# Patient Record
Sex: Male | Born: 1945 | Race: Black or African American | Hispanic: No | Marital: Married
Health system: Southern US, Community
[De-identification: ages and names within clinical notes are randomized; demographics above are authoritative.]

---

## 2006-07-31 ENCOUNTER — Emergency Department: Payer: Self-pay | Admitting: Emergency Medicine

## 2007-06-08 ENCOUNTER — Ambulatory Visit: Payer: Self-pay | Admitting: Gastroenterology

## 2008-11-12 IMAGING — CR DG HUMERUS 2V *R*
1 series · 3 of 3 positions shown · non-contrast
Comparison: none

REASON FOR EXAM: pain, rm 2
COMMENTS:

PROCEDURE:     DXR - DXR HUMERUS RIGHT  - July 31, 2006  [DATE]
RESULT:     No fracture, dislocation or other acute bony abnormality is
identified.

[Series 1: view not recorded · 0.17mm/px · 3 of 3 slices shown]
[im 1/3]
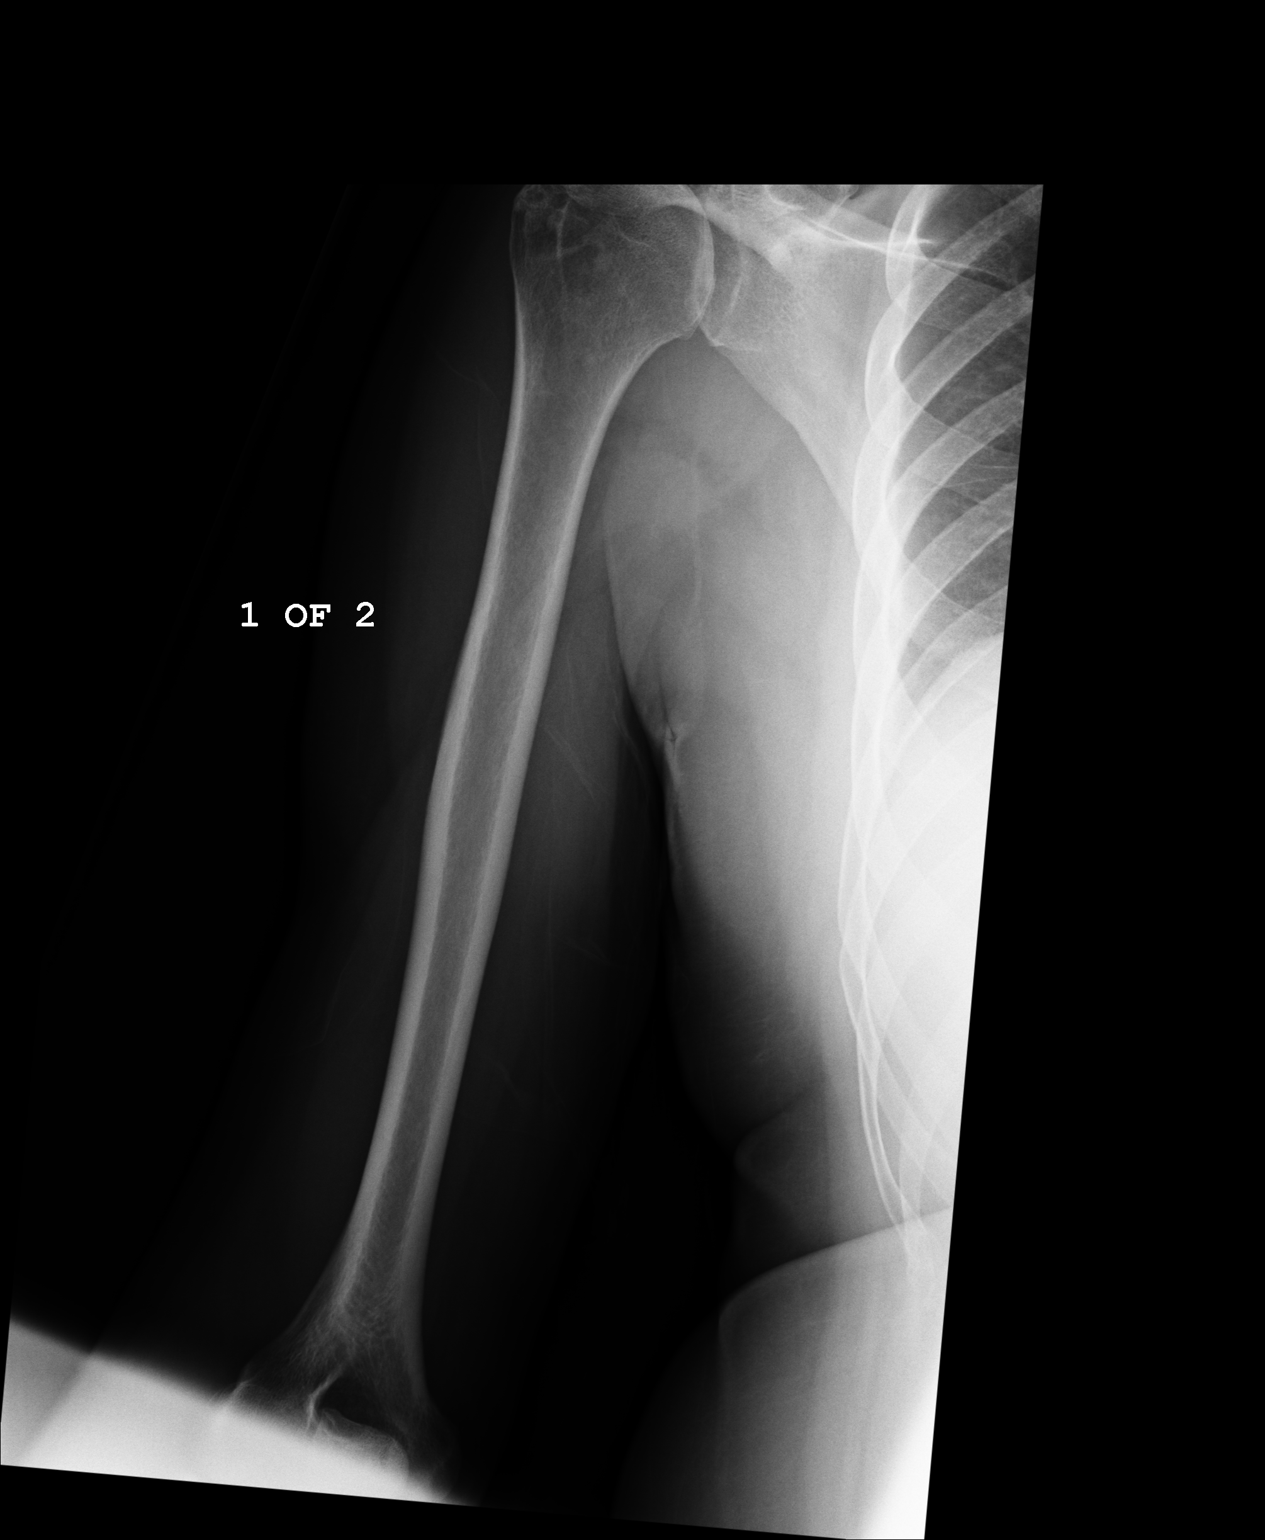
[im 2/3]
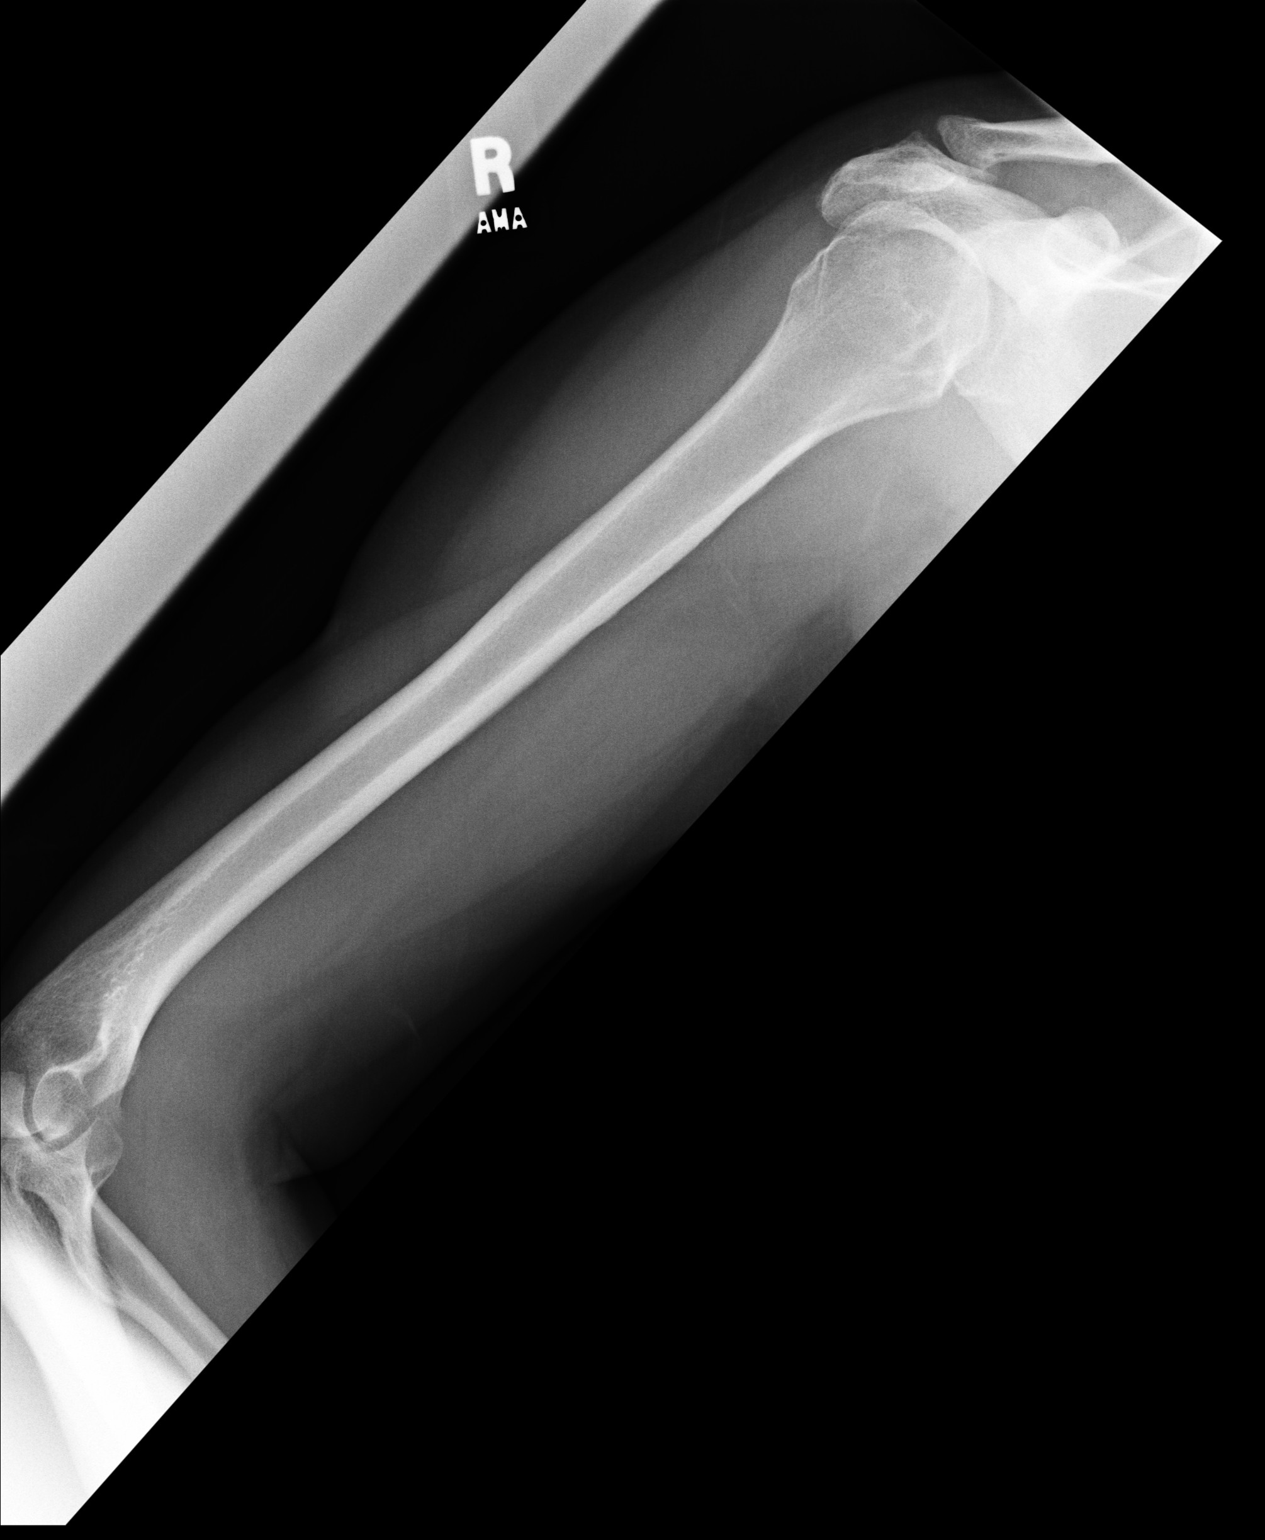
[im 3/3]
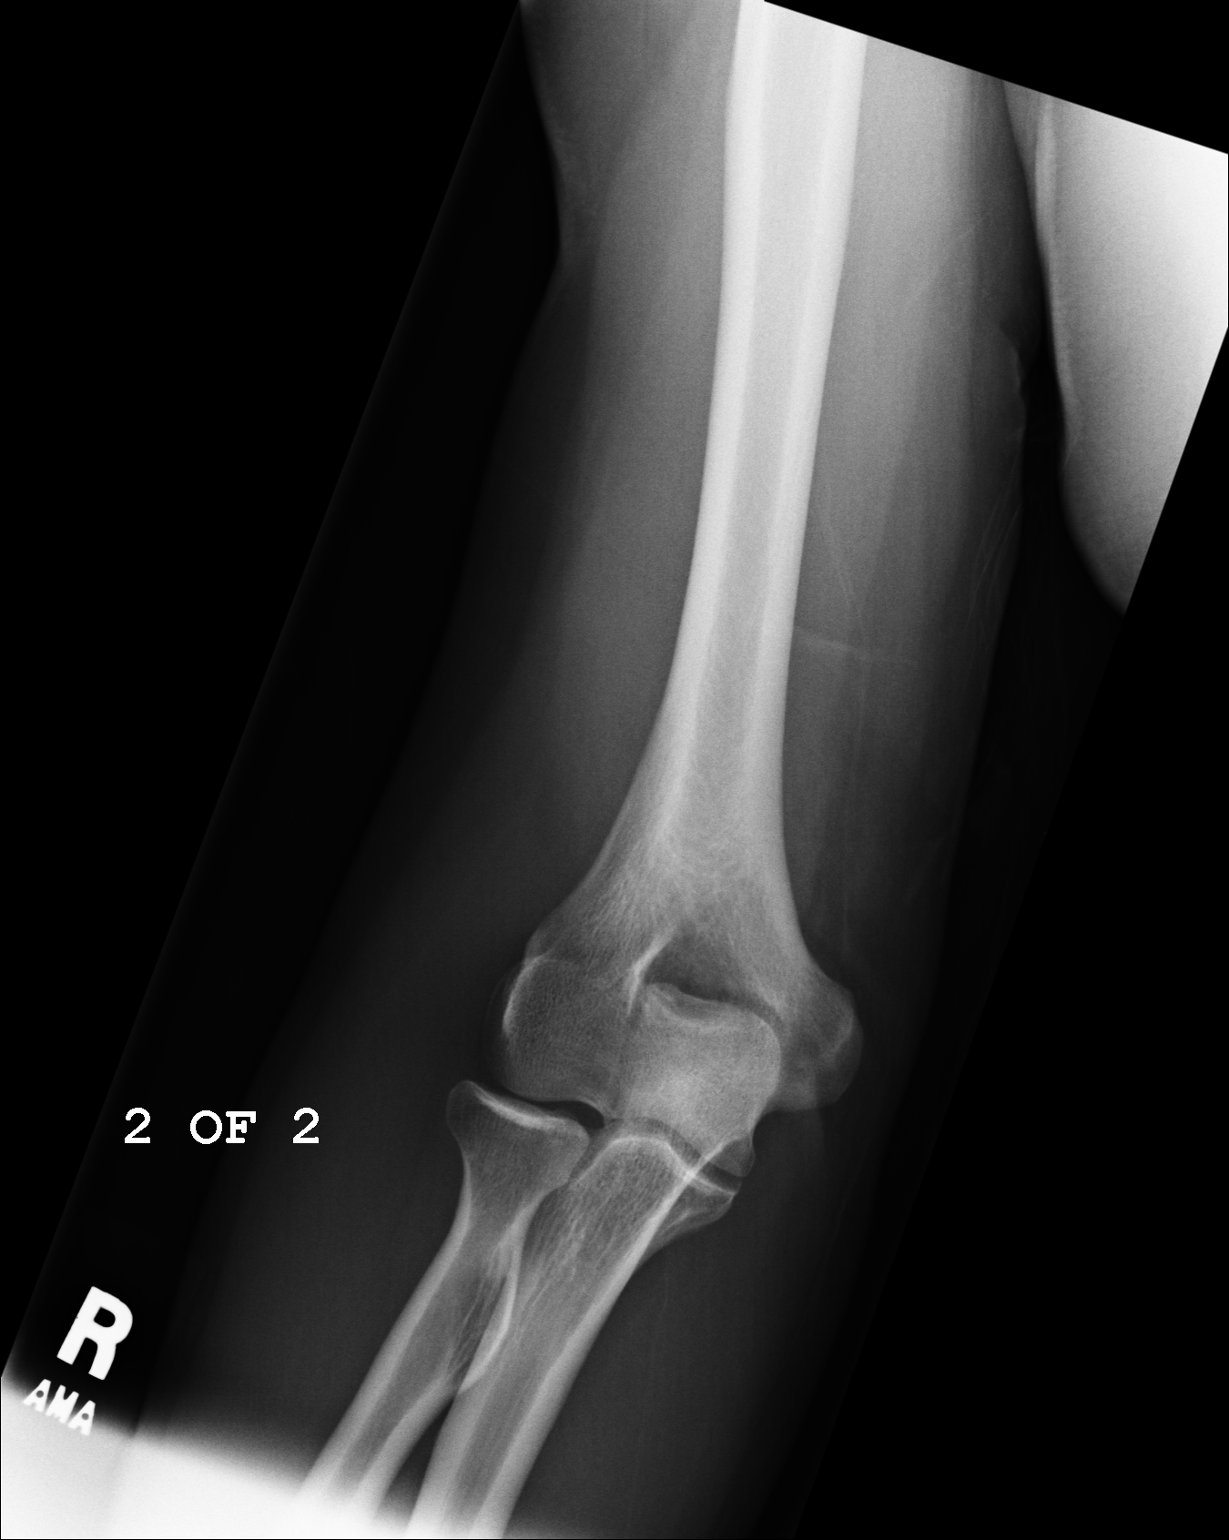

[3 of 3 positions shown; findings below may reference images not displayed]

IMPRESSION: 1.     No acute changes are identified.

## 2014-12-17 ENCOUNTER — Encounter: Payer: Self-pay | Admitting: *Deleted

## 2018-03-24 ENCOUNTER — Other Ambulatory Visit: Payer: Self-pay

## 2018-03-24 DIAGNOSIS — R109 Unspecified abdominal pain: Secondary | ICD-10-CM | POA: Insufficient documentation

## 2018-03-24 DIAGNOSIS — Z5321 Procedure and treatment not carried out due to patient leaving prior to being seen by health care provider: Secondary | ICD-10-CM | POA: Insufficient documentation

## 2018-03-24 NOTE — ED Triage Notes (Signed)
Reports symptoms for 3-4 hours.  Reports right flank pain that radiates around into entire abdomen.

## 2018-03-25 ENCOUNTER — Emergency Department
Admission: EM | Admit: 2018-03-25 | Discharge: 2018-03-25 | Discharge status: Against Medical Advice | Payer: Self-pay | Attending: Emergency Medicine | Admitting: Emergency Medicine

## 2018-03-25 LAB — COMPREHENSIVE METABOLIC PANEL
ALK PHOS: 85 U/L (ref 38–126)
ALT: 17 U/L (ref 0–44)
ANION GAP: 11 (ref 5–15)
AST: 28 U/L (ref 15–41)
Albumin: 3.8 g/dL (ref 3.5–5.0)
BUN: 21 mg/dL (ref 8–23)
CALCIUM: 9.2 mg/dL (ref 8.9–10.3)
CO2: 21 mmol/L — ABNORMAL LOW (ref 22–32)
CREATININE: 1.56 mg/dL — AB (ref 0.61–1.24)
Chloride: 107 mmol/L (ref 98–111)
GFR, EST AFRICAN AMERICAN: 51 mL/min — AB (ref >60)
GFR, EST NON AFRICAN AMERICAN: 44 mL/min — AB (ref >60)
Glucose, Bld: 127 mg/dL — ABNORMAL HIGH (ref 70–99)
Potassium: 3.3 mmol/L — ABNORMAL LOW (ref 3.5–5.1)
Sodium: 139 mmol/L (ref 135–145)
Total Bilirubin: 1.5 mg/dL — ABNORMAL HIGH (ref 0.3–1.2)
Total Protein: 7.5 g/dL (ref 6.5–8.1)

## 2018-03-25 LAB — CBC
HEMATOCRIT: 37.8 % — AB (ref 39.0–52.0)
HEMOGLOBIN: 13.1 g/dL (ref 13.0–17.0)
MCH: 30.1 pg (ref 26.0–34.0)
MCHC: 34.7 g/dL (ref 30.0–36.0)
MCV: 86.9 fL (ref 80.0–100.0)
Platelets: 144 10*3/uL — ABNORMAL LOW (ref 150–400)
RBC: 4.35 MIL/uL (ref 4.22–5.81)
RDW: 13.2 % (ref 11.5–15.5)
WBC: 6.8 10*3/uL (ref 4.0–10.5)
nRBC: 0 % (ref 0.0–0.2)

## 2018-03-25 LAB — LIPASE, BLOOD: Lipase: 79 U/L — ABNORMAL HIGH (ref 11–51)

## 2018-03-25 NOTE — ED Notes (Signed)
Family to stat desk asking about wait time. Family given update on wait time. Family verbalizes understanding.  

## 2018-03-28 ENCOUNTER — Telehealth: Payer: Self-pay | Admitting: Emergency Medicine

## 2018-03-28 NOTE — Telephone Encounter (Signed)
Called patient due to lwot to inquire about condition and follow up plans. Left message.
